# Patient Record
Sex: Male | Born: 1995
Health system: Southern US, Community
[De-identification: ages and names within clinical notes are randomized; demographics above are authoritative.]

## PROBLEM LIST (undated history)

## (undated) DIAGNOSIS — L0591 Pilonidal cyst without abscess: Secondary | ICD-10-CM

## (undated) DIAGNOSIS — S82409A Unspecified fracture of shaft of unspecified fibula, initial encounter for closed fracture: Secondary | ICD-10-CM

## (undated) DIAGNOSIS — Z8711 Personal history of peptic ulcer disease: Secondary | ICD-10-CM

## (undated) DIAGNOSIS — R221 Localized swelling, mass and lump, neck: Secondary | ICD-10-CM

## (undated) DIAGNOSIS — S83512A Sprain of anterior cruciate ligament of left knee, initial encounter: Secondary | ICD-10-CM

## (undated) DIAGNOSIS — Z8719 Personal history of other diseases of the digestive system: Secondary | ICD-10-CM

## (undated) DIAGNOSIS — Q549 Hypospadias, unspecified: Secondary | ICD-10-CM

## (undated) HISTORY — PX: FINGER SURGERY: SHX640

## (undated) HISTORY — PX: OTHER SURGICAL HISTORY: SHX169

## (undated) HISTORY — PX: KNEE ARTHROSCOPY: SUR90

## (undated) HISTORY — DX: Personal history of peptic ulcer disease: Z87.11

## (undated) HISTORY — DX: Hypospadias, unspecified: Q54.9

## (undated) HISTORY — DX: Localized swelling, mass and lump, neck: R22.1

## (undated) HISTORY — DX: Sprain of anterior cruciate ligament of left knee, initial encounter: S83.512A

## (undated) HISTORY — DX: Unspecified fracture of shaft of unspecified fibula, initial encounter for closed fracture: S82.409A

## (undated) HISTORY — DX: Personal history of other diseases of the digestive system: Z87.19

## (undated) HISTORY — DX: Pilonidal cyst without abscess: L05.91

---
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
## 2015-06-11 ENCOUNTER — Other Ambulatory Visit: Payer: Self-pay | Admitting: Otolaryngology

## 2015-06-11 ENCOUNTER — Ambulatory Visit
Admission: RE | Admit: 2015-06-11 | Discharge: 2015-06-11 | Disposition: A | Discharge status: Home / Self Care | Payer: BC Managed Care – PPO | Source: Ambulatory Visit | Attending: Otolaryngology | Admitting: Otolaryngology

## 2015-06-11 DIAGNOSIS — R591 Generalized enlarged lymph nodes: Secondary | ICD-10-CM

## 2015-06-12 ENCOUNTER — Other Ambulatory Visit: Payer: Self-pay | Admitting: Otolaryngology

## 2015-06-12 DIAGNOSIS — R591 Generalized enlarged lymph nodes: Secondary | ICD-10-CM

## 2015-06-13 ENCOUNTER — Ambulatory Visit
Admission: RE | Admit: 2015-06-13 | Discharge: 2015-06-13 | Disposition: A | Discharge status: Home / Self Care | Payer: Self-pay | Source: Ambulatory Visit | Attending: Otolaryngology | Admitting: Otolaryngology

## 2015-06-13 DIAGNOSIS — R591 Generalized enlarged lymph nodes: Secondary | ICD-10-CM

## 2016-03-15 ENCOUNTER — Encounter: Payer: Self-pay | Admitting: Family Medicine

## 2016-03-15 ENCOUNTER — Ambulatory Visit (INDEPENDENT_AMBULATORY_CARE_PROVIDER_SITE_OTHER): Payer: BC Managed Care – PPO | Admitting: Family Medicine

## 2016-03-15 VITALS — BP 106/78 | HR 63 | Temp 98.1°F | Ht 71.5 in | Wt 244.2 lb

## 2016-03-15 DIAGNOSIS — R221 Localized swelling, mass and lump, neck: Secondary | ICD-10-CM | POA: Diagnosis not present

## 2016-03-15 DIAGNOSIS — Z0001 Encounter for general adult medical examination with abnormal findings: Secondary | ICD-10-CM | POA: Diagnosis not present

## 2016-03-15 HISTORY — DX: Localized swelling, mass and lump, neck: R22.1

## 2016-03-15 LAB — TSH: TSH: 1.5 u[IU]/mL (ref 0.35–5.50)

## 2016-03-15 LAB — LIPID PANEL
CHOL/HDL RATIO: 3
Cholesterol: 146 mg/dL (ref 0–200)
HDL: 46.4 mg/dL (ref >39.00)
LDL CALC: 92 mg/dL (ref 0–99)
NONHDL: 99.87
Triglycerides: 40 mg/dL (ref 0.0–149.0)
VLDL: 8 mg/dL (ref 0.0–40.0)

## 2016-03-15 LAB — POC URINALSYSI DIPSTICK (AUTOMATED)
Bilirubin, UA: NEGATIVE
Glucose, UA: NEGATIVE
KETONES UA: NEGATIVE
Leukocytes, UA: NEGATIVE
Nitrite, UA: NEGATIVE
PROTEIN UA: NEGATIVE
RBC UA: NEGATIVE
SPEC GRAV UA: 1.025
UROBILINOGEN UA: 0.2
pH, UA: 5.5

## 2016-03-15 LAB — CBC WITH DIFFERENTIAL/PLATELET
BASOS ABS: 0 10*3/uL (ref 0.0–0.1)
BASOS PCT: 0.5 % (ref 0.0–3.0)
EOS ABS: 0.1 10*3/uL (ref 0.0–0.7)
Eosinophils Relative: 2 % (ref 0.0–5.0)
HEMATOCRIT: 46.8 % (ref 39.0–52.0)
HEMOGLOBIN: 15.9 g/dL (ref 13.0–17.0)
LYMPHS PCT: 36.2 % (ref 12.0–46.0)
Lymphs Abs: 2.1 10*3/uL (ref 0.7–4.0)
MCHC: 33.9 g/dL (ref 30.0–36.0)
MCV: 86.8 fl (ref 78.0–100.0)
MONO ABS: 0.4 10*3/uL (ref 0.1–1.0)
Monocytes Relative: 6.5 % (ref 3.0–12.0)
Neutro Abs: 3.2 10*3/uL (ref 1.4–7.7)
Neutrophils Relative %: 54.8 % (ref 43.0–77.0)
PLATELETS: 214 10*3/uL (ref 150.0–400.0)
RBC: 5.39 Mil/uL (ref 4.22–5.81)
RDW: 13.5 % (ref 11.5–14.6)
WBC: 5.8 10*3/uL (ref 4.5–10.5)

## 2016-03-15 LAB — COMPREHENSIVE METABOLIC PANEL
ALT: 40 U/L (ref 0–53)
AST: 26 U/L (ref 0–37)
Albumin: 4.7 g/dL (ref 3.5–5.2)
Alkaline Phosphatase: 57 U/L (ref 39–117)
BILIRUBIN TOTAL: 0.6 mg/dL (ref 0.2–1.2)
BUN: 13 mg/dL (ref 6–23)
CALCIUM: 9.4 mg/dL (ref 8.4–10.5)
CHLORIDE: 105 meq/L (ref 96–112)
CO2: 25 meq/L (ref 19–32)
CREATININE: 0.98 mg/dL (ref 0.40–1.50)
GFR: 102.98 mL/min (ref >60.00)
Glucose, Bld: 94 mg/dL (ref 70–99)
Potassium: 4.2 mEq/L (ref 3.5–5.1)
SODIUM: 140 meq/L (ref 135–145)
Total Protein: 7.2 g/dL (ref 6.0–8.3)

## 2016-03-15 NOTE — Assessment & Plan Note (Signed)
S: patient noted some enlargement left lateral neck last spring. Saw his pediatrician. Ultrasound was done stating likely lipoma but noting consideration of MRI or CT if further clinical cocnern. Patient states since it was noted he has only noted mild enlargement. No pain or discomfort.  A/P: We discussed MRI or CT and discussed concerns of cost and radiation. Patient states he would not want to have removed unless it needed to be- considered ENT referral initially. Mother and grandmother are primarily concerned and he is going to go talk with them and then let me know within a week his decision.

## 2016-03-15 NOTE — Progress Notes (Signed)
Pre visit review using our clinic review tool, if applicable. No additional management support is needed unless otherwise documented below in the visit note. 

## 2016-03-15 NOTE — Patient Instructions (Signed)
Labs before you leave  Talk things over with your mom and grandma. See considerations I noted on the imaging report. I would really love to know what you guys would wish to do within a week or so.   Declined flu shot  I agree targetting a weight of 225 is reasonable over next year

## 2016-03-15 NOTE — Progress Notes (Signed)
Phone: [phone]  Subjective:  Patient presents today to establish care. Former patient of Dr. Aggie Hacker at [facility]. Immunizations reviewed and up to date- including Tdap in 2008. Records in [place]  Chief complaint-noted.   See problem oriented charting  The following were reviewed and entered/updated in epic: No past medical history on file. There are no active problems to display for this patient.  Past Surgical History:  Procedure Laterality Date  . FINGER SURGERY Right    index finger  . KNEE ARTHROSCOPY Left    tendon repair    Family History  Problem Relation Age of Onset  . Breast cancer Mother     Medications- reviewed and updated Current Outpatient Prescriptions  Medication Sig Dispense Refill  . loratadine (CLARITIN) 10 MG tablet Take 10 mg by mouth daily.     No current facility-administered medications for this visit.     Allergies-reviewed and updated No Known Allergies  Social History   Social History  . Marital status: Single    Spouse name: N/A  . Number of children: 0  . Years of education: N/A   Occupational History  . student    Social History Main Topics  . Smoking status: Former Smoker    Types: Cigarettes    Quit date: 04/04/2014  . Smokeless tobacco: Former Neurosurgeon    Types: Chew    Quit date: 04/04/2014  . Alcohol use 0.0 oz/week     Comment: occasional  . Drug use: No  . Sexual activity: Not Asked   Other Topics Concern  . None   Social History Narrative  . None    ROS--Full ROS was completed Review of Systems  Constitutional: Negative for chills and fever.  HENT: Negative for hearing loss.   Eyes: Negative for blurred vision and double vision.  Respiratory: Negative for cough and hemoptysis.   Cardiovascular: Negative for chest pain and palpitations.  Gastrointestinal: Negative for heartburn and nausea.  Genitourinary: Negative for dysuria and urgency.  Musculoskeletal: Negative for myalgias and neck pain.    Skin: Negative for itching and rash.  Neurological: Negative for dizziness, tingling and headaches.  Endo/Heme/Allergies: Does not bruise/bleed easily.  Psychiatric/Behavioral: Negative for hallucinations and substance abuse.   Objective: BP 106/78 (BP Location: Left Arm, Patient Position: Sitting, Cuff Size: Large)   Pulse 63   Temp 98.1 F (36.7 C) (Oral)   Ht 5' 11.5" (1.816 m)   Wt 244 lb 3.2 oz (110.8 kg)   SpO2 97%   BMI 33.58 kg/m  Gen: NAD, resting comfortably HEENT: Mucous membranes are moist. Oropharynx normal. TM normal. Eyes: sclera and lids normal, PERRLA Neck: no thyromegaly, no cervical lymphadenopathy On left side of neck has 7 x 7 cm round soft raised mass CV: RRR no murmurs rubs or gallops Lungs: CTAB no crackles, wheeze, rhonchi Abdomen: soft/nontender/nondistended/normal bowel sounds. No rebound or guarding.  Ext: no edema Skin: warm, dry Neuro: 5/5 strength in upper and lower extremities, normal gait, normal reflexes  Assessment/Plan:  Mass of left side of neck S: patient noted some enlargement left lateral neck last spring. Saw his pediatrician. Ultrasound was done stating likely lipoma but noting consideration of MRI or CT if further clinical cocnern. Patient states since it was noted he has only noted mild enlargement. No pain or discomfort.  A/P: We discussed MRI or CT and discussed concerns of cost and radiation. Patient states he would not want to have removed unless it needed to be- considered ENT referral initially. Mother  and grandmother are primarily concerned and he is going to go talk with them and then let me know within a week his decision.   20 y.o. male presenting for annual physical.  Health Maintenance counseling: 1. Anticipatory guidance: Patient counseled regarding regular dental exams, eye exams, wearing seatbelts.  2. Risk factor reduction:  Advised patient of need for regular exercise and diet rich and fruits and vegetables to reduce  risk of heart attack and stroke. Needs some weight loss. At least to HS weight 225 3. Immunizations/screenings/ancillary studies 4. Prostate cancer screening- no family history start age 20  5. Colon cancer screening - no family history, start age 20 6. testicular cancer screening- will need to discuss monthly self exams 7. Std screening- declines. Practices abstinence  Return in about 1 year (around 03/15/2017) for physical.   Orders Placed This Encounter  Procedures  . CBC with Differential/Platelet  . Comprehensive metabolic panel    [place]    Order Specific Question:   Has the patient fasted?    Answer:   No  . TSH    [place]  . Lipid panel    [place]    Order Specific Question:   Has the patient fasted?    Answer:   No  . POCT Urinalysis Dipstick (Automated)   Return precautions advised.  Tana ConchStephen Leylah Tarnow, MD

## 2016-05-01 ENCOUNTER — Encounter: Payer: Self-pay | Admitting: Family Medicine

## 2016-05-01 DIAGNOSIS — Z8711 Personal history of peptic ulcer disease: Secondary | ICD-10-CM | POA: Insufficient documentation

## 2016-05-01 DIAGNOSIS — Z8719 Personal history of other diseases of the digestive system: Secondary | ICD-10-CM | POA: Insufficient documentation

## 2016-08-23 ENCOUNTER — Encounter: Payer: Self-pay | Admitting: Family Medicine

## 2016-08-23 ENCOUNTER — Ambulatory Visit (INDEPENDENT_AMBULATORY_CARE_PROVIDER_SITE_OTHER): Payer: BC Managed Care – PPO | Admitting: Family Medicine

## 2016-08-23 VITALS — BP 108/68 | HR 58 | Temp 99.1°F | Ht 71.5 in | Wt 251.8 lb

## 2016-08-23 DIAGNOSIS — H9192 Unspecified hearing loss, left ear: Secondary | ICD-10-CM

## 2016-08-23 DIAGNOSIS — H6122 Impacted cerumen, left ear: Secondary | ICD-10-CM

## 2016-08-23 NOTE — Patient Instructions (Signed)
Ear looks great after irrigation  Could trial once a week 3-4 drops of mineral oil for prevention or could use this if starts to feel more stopped up- a few times a day

## 2016-08-23 NOTE — Progress Notes (Signed)
Subjective:  Marya Amslerrescott Icenhour IV is a 21 y.o. year old very pleasant male patient who presents for/with See problem oriented charting ROS- no fever, chills, nausea, vomiting. Denies ear pain.    Past Medical History-  Patient Active Problem List   Diagnosis Date Noted  . Mass of left side of neck 03/15/2016    Priority: Medium  . History of gastric ulcer     Priority: Low    Medications- reviewed and updated No current outpatient prescriptions on file.   No current facility-administered medications for this visit.     Objective: BP 108/68 (BP Location: Left Arm, Patient Position: Sitting, Cuff Size: Large)   Pulse (!) 58   Temp 99.1 F (37.3 C) (Oral)   Ht 5' 11.5" (1.816 m)   Wt 251 lb 12.8 oz (114.2 kg)   SpO2 98%   BMI 34.63 kg/m  Gen: NAD, resting comfortably TM normal on right. TM on left completely obstructed. Cerumen irrigated with complete resolution of blockage- TM normal. Canal mildly red and irritated at bottom of canal (attempted [facility]). Oropharynx normal. Nares normal.  CV: RRR no murmurs rubs or gallops Lungs: CTAB no crackles, wheeze, rhonchi Ext: no edema Skin: warm, dry  Assessment/Plan:  Hearing loss of left ear Impacted cerumen of left ear S: Patient states for several years he has had issues with cerumen impactions. Bilateral , left always worse. Winter worse.   January started with feeling of fullness left ear then gradually worsened- noted wax on pillow some nights. Saturday felt really full, on Sunday couldn't hear a thing. Just the left side. Right side with no issues  [facility] and [facility] curetting as cerumen impaction and noted as potential source of hearing loss - apparently there was some pain and light level of bleeding A/P: Left cerumen impaction causing hearing loss. Upon removal full relief of hearing loss. Remaining canal mildly red- suspect may have been from the curetting attempted  yesterday. We also discussed using mineral oil perhaps once a week 3-4 drops to help with prevention or could increase if ears begin to feel full  Return precautions advised.  Tana ConchStephen Ginnie Marich, MD

## 2016-08-23 NOTE — Progress Notes (Signed)
Pre visit review using our clinic review tool, if applicable. No additional management support is needed unless otherwise documented below in the visit note. 

## 2016-11-09 ENCOUNTER — Ambulatory Visit (INDEPENDENT_AMBULATORY_CARE_PROVIDER_SITE_OTHER): Payer: BC Managed Care – PPO | Admitting: Family Medicine

## 2016-11-09 ENCOUNTER — Encounter: Payer: Self-pay | Admitting: Family Medicine

## 2016-11-09 VITALS — BP 126/68 | HR 93 | Temp 98.4°F | Ht 71.5 in | Wt 245.4 lb

## 2016-11-09 DIAGNOSIS — S30861A Insect bite (nonvenomous) of abdominal wall, initial encounter: Secondary | ICD-10-CM | POA: Diagnosis not present

## 2016-11-09 DIAGNOSIS — W57XXXA Bitten or stung by nonvenomous insect and other nonvenomous arthropods, initial encounter: Secondary | ICD-10-CM

## 2016-11-09 MED ORDER — DOXYCYCLINE HYCLATE 100 MG PO TABS: 100.0000 mg | ORAL_TABLET | Freq: Two times a day (BID) | ORAL | 0 refills | Status: DC

## 2016-11-09 NOTE — Patient Instructions (Addendum)
Doxycycline for 7 days. Likely this is just local irritatoin from the tick bite and then getting the tick out but this will cover you for skin infections. Its also the treatment that we would use for lyme or rocky mountain spotted fever so you are covered for those- an added bonus!   Need to minimize time out in the son.   See us back for new or worsening symptoms but suspect this will gradually improve over the next 2-4 weeks ______________________________________________________ WE NOW OFFER   [facility] FAST TRACK!!!  SAME DAY Appointments for ACUTE CARE  Such as: Sprains, Injuries, cuts, abrasions, rashes, muscle pain, joint pain, back pain Colds, flu, sore throats, headache, allergies, cough, fever  Ear pain, sinus and eye infections Abdominal pain, nausea, vomiting, diarrhea, upset stomach Animal/insect bites  3 Easy Ways to Schedule: Walk-In Scheduling Call in scheduling Mychart Sign-up: [email or web address]

## 2016-11-09 NOTE — Progress Notes (Signed)
Subjective:  Derek Carter is a 21 y.o. year old very pleasant male patient who presents for/with See problem oriented charting ROS- no fever or chills.  No body aches. No rash on body  Past Medical History-  Patient Active Problem List   Diagnosis Date Noted  . Mass of left side of neck 03/15/2016    Priority: Medium  . History of gastric ulcer     Priority: Low   Medications- reviewed and updated, no rx prior to visit  Objective: BP 126/68   Pulse 93   Temp 98.4 F (36.9 C) (Oral)   Ht 5' 11.5" (1.816 m)   Wt 245 lb 6.4 oz (111.3 kg)   SpO2 99%   BMI 33.75 kg/m  Gen: NAD, resting comfortably CV: RRR no murmurs rubs or gallops Lungs: CTAB no crackles, wheeze, rhonchi Ext: no edema GU: at top of shaft of penis 0.5 x 0.5 cm indurated area with scab at the top (site of prior tick bite). Mild local erythema surrounding site  Assessment/Plan:  Tick bite of groin, initial encounter S: Patient noted a tick bite on Sunday at the base of his penis. He removed it initially but head was still intact. Apparently manipulated the area with a "sterlized" needle and was able to get the head out. States tick was not fully engorged- not sure how long it was there. He has had very mild pain in area as well as swelling in area that tick was removed. It has produced a bump on his penis with central scarring A/P: Tick bite on shaft of penis. Localized irritation I suspect is most likely due to inflammation from bite as well as manipulation with needle that was done. We opted in the end given importance of this region to a young male and possible early cellulitis to cover with doxycycline. Discussed would also cover for most common tick bite ailments as well.   Meds ordered this encounter  Medications  . doxycycline (VIBRA-TABS) 100 MG tablet    Sig: Take 1 tablet (100 mg total) by mouth 2 (two) times daily.    Dispense:  14 tablet    Refill:  0    Return precautions advised.  Tana ConchStephen  Hunter, MD

## 2017-08-22 IMAGING — US US SOFT TISSUE HEAD/NECK
1 series · 14 of 25 positions shown · non-contrast
Comparison: None.

CLINICAL DATA: Left neck swelling x3-4 months, intermittent
tenderness

EXAM:
THYROID ULTRASOUND
TECHNIQUE: Ultrasound examination of the thyroid gland and adjacent soft
tissues was performed.

[Series 1: us soft tissue head/neck · 0.08mm/px · 14 of 73 slices shown]
[im 1/73]
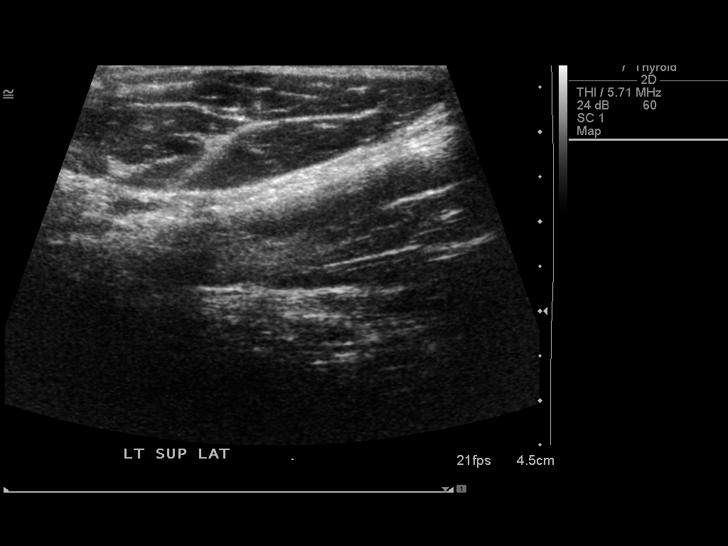
[im 7/73]
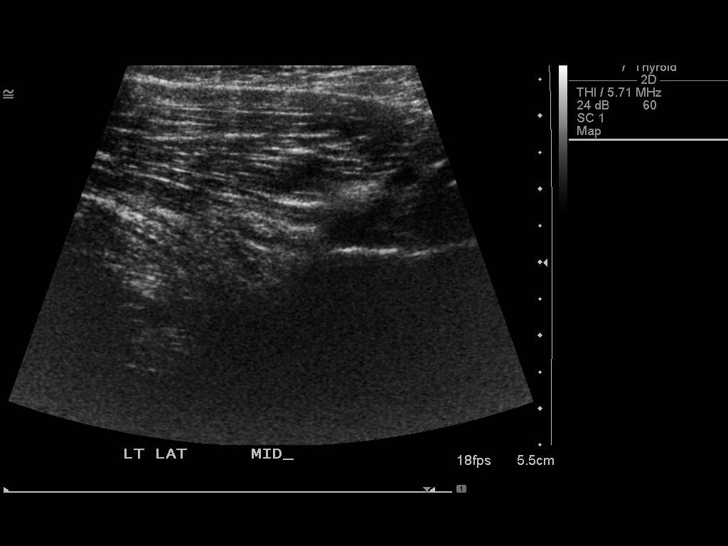
[im 13/73]
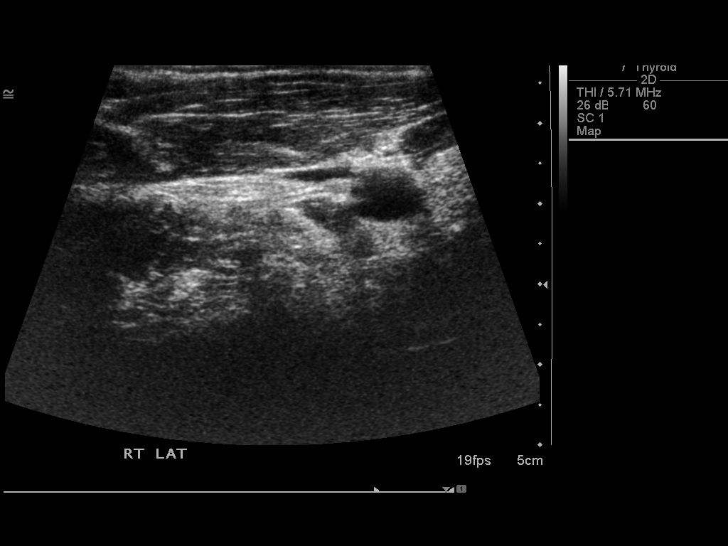
[im 19/73]
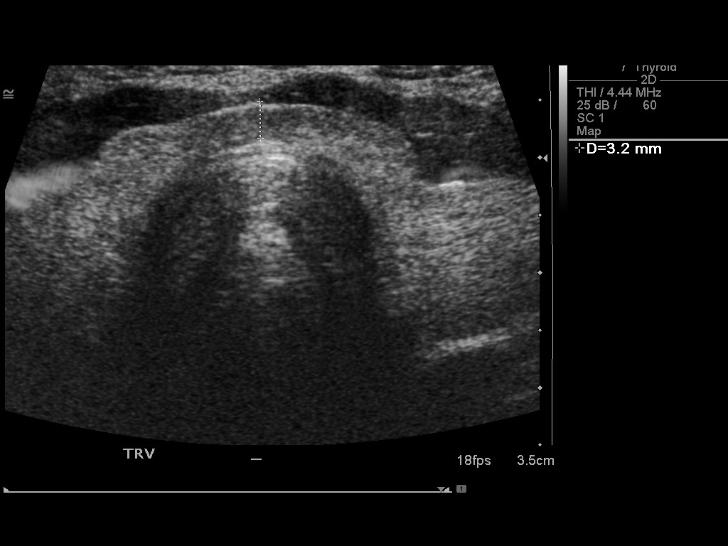
[im 25/73]
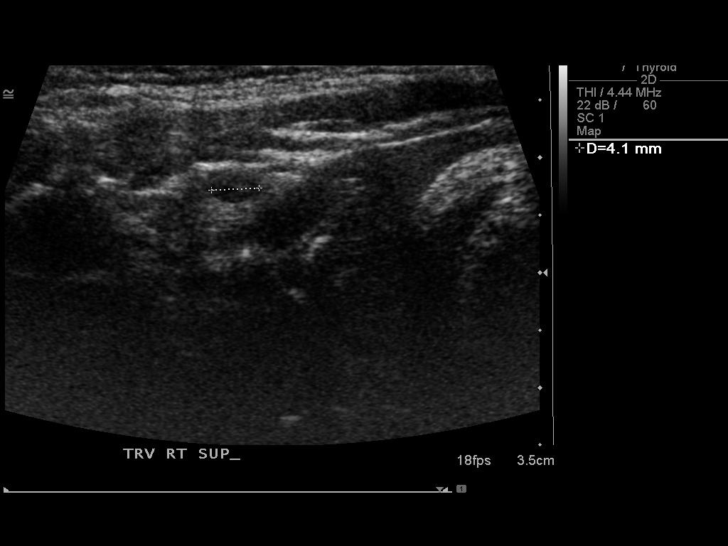
[im 28/73]
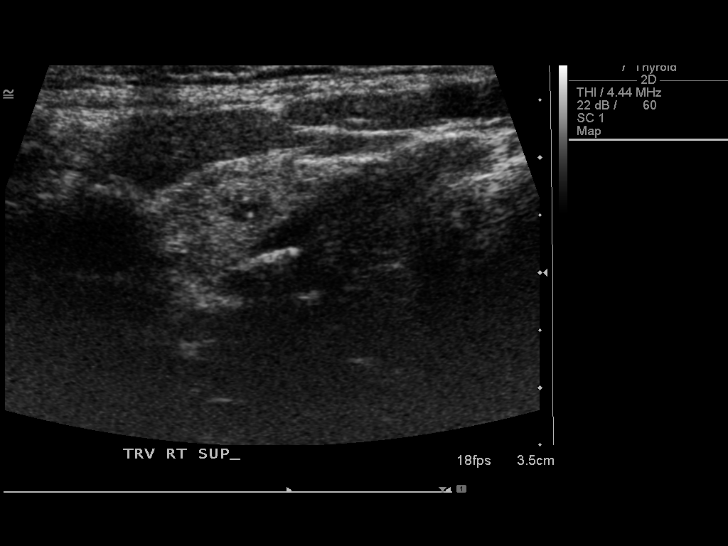
[im 34/73]
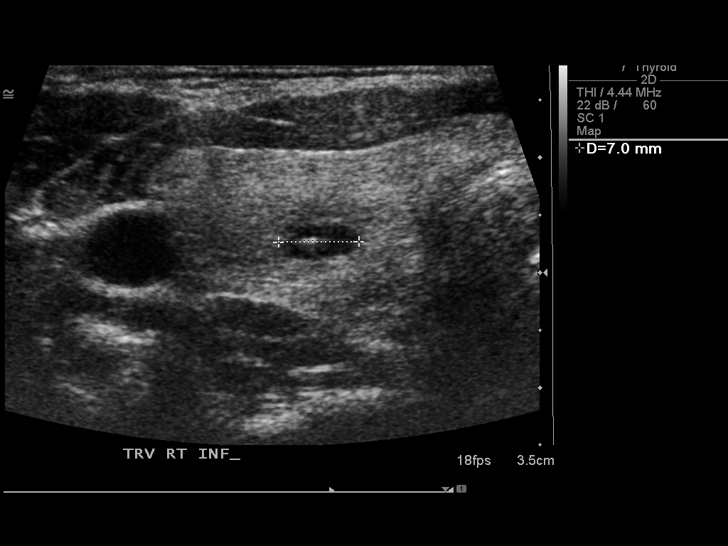
[im 40/73]
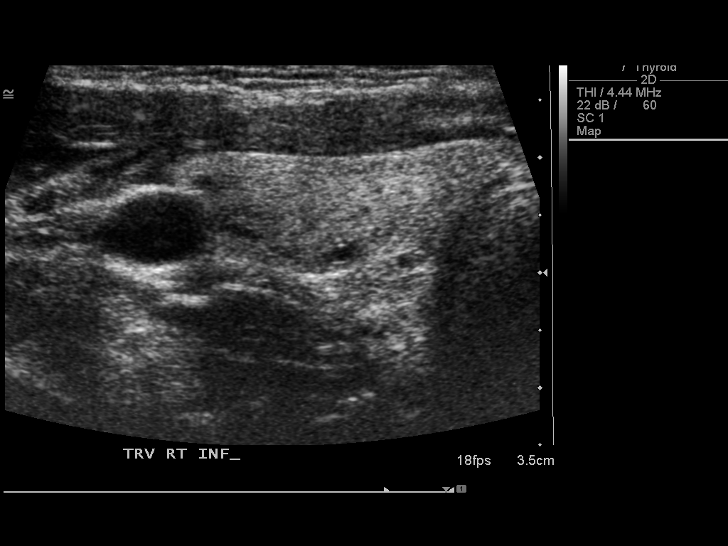
[im 46/73]
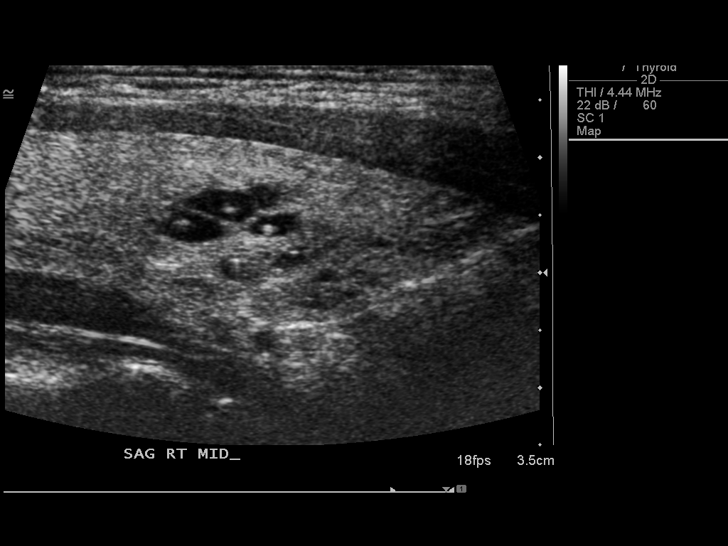
[im 49/73]
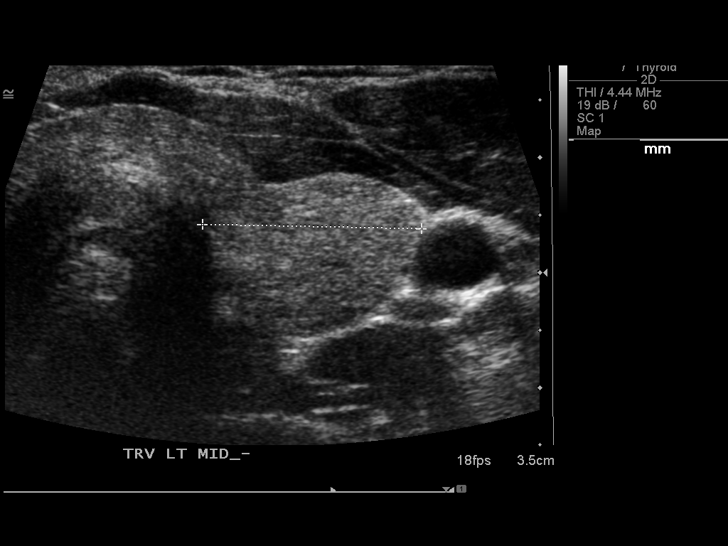
[im 55/73]
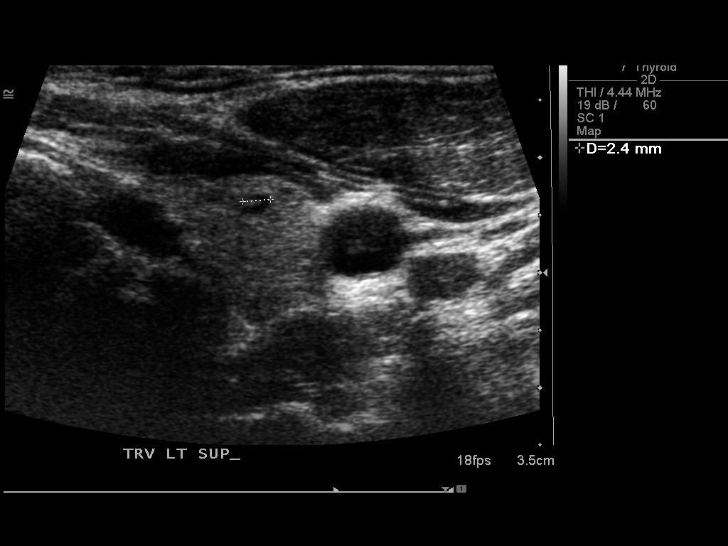
[im 61/73]
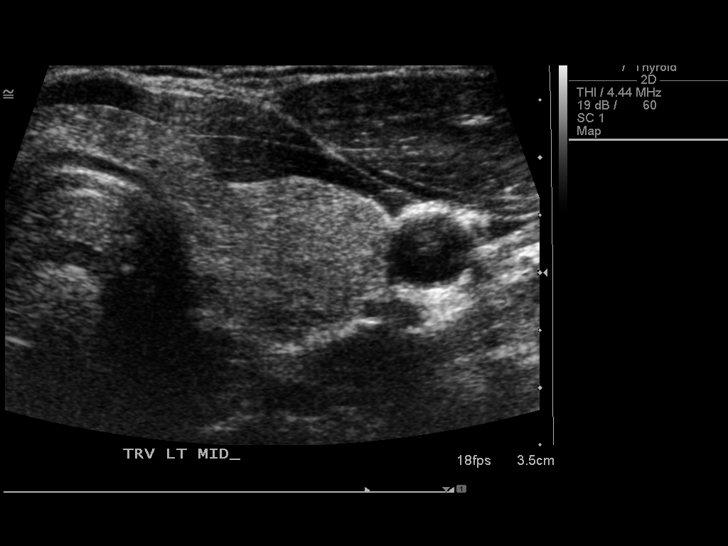
[im 67/73]
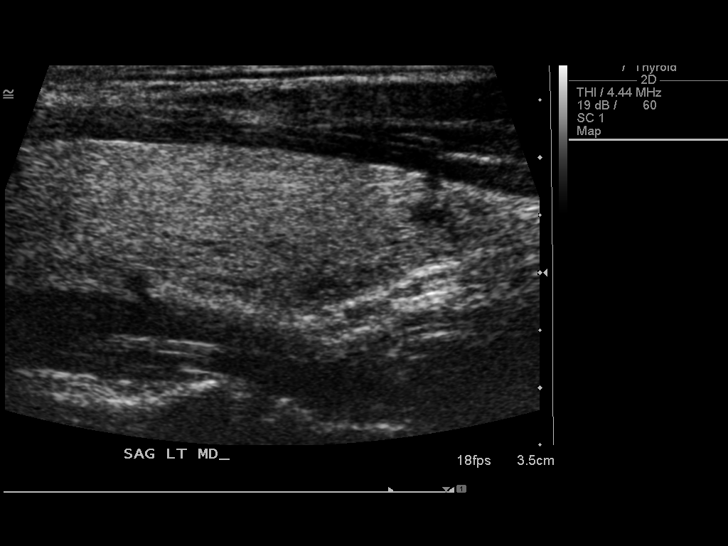
[im 73/73]
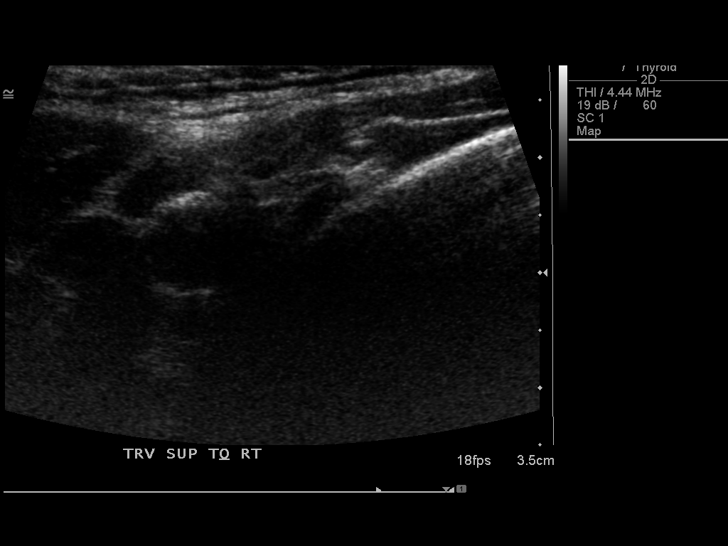

[14 of 25 positions shown; findings below may reference images not displayed]

FINDINGS: Right thyroid lobe

Measurements: 54 x 14 x 22 mm. Scattered complex cysts measuring 7mm
or less maximum diameter.

Left thyroid lobe

Measurements: 57 x 16 x 19 mm. Scattered complex cysts measuring
less than 5 mm maximum diameter.

Isthmus

Thickness: 3.2 mm.  No nodules visualized.

Lymphadenopathy

None visualized.
IMPRESSION: 1. Mild thyromegaly with small bilateral complex cysts. Findings do
not meet current consensus criteria for biopsy. Follow-up by
clinical exam is recommended. If patient has known risk factors for
thyroid carcinoma, consider follow-up ultrasound in 12 months. If
patient is clinically hyperthyroid, consider nuclear medicine
thyroid uptake and scan. This recommendation follows the consensus
statement: Management of Thyroid Nodules Detected as US: Society of
Radiologists in Ultrasound Consensus Conference Statement. Radiology

## 2017-08-24 IMAGING — US US SOFT TISSUE HEAD/NECK
1 series · 14 of 25 positions shown · non-contrast
Comparison: Thyroid ultrasound 06/11/2015

ADDENDUM:
No additional charge for today's study in follow-up to neck
ultrasound of 06/11/2015.
CLINICAL DATA: Asymmetric swelling left neck

EXAM:
ULTRASOUND OF HEAD/NECK SOFT TISSUES
TECHNIQUE: Ultrasound examination of the head and neck soft tissues was
performed in the area of clinical concern.

[Series 1: us soft tissue head/neck · 0.08mm/px · 14 of 30 slices shown]
[im 1/30]
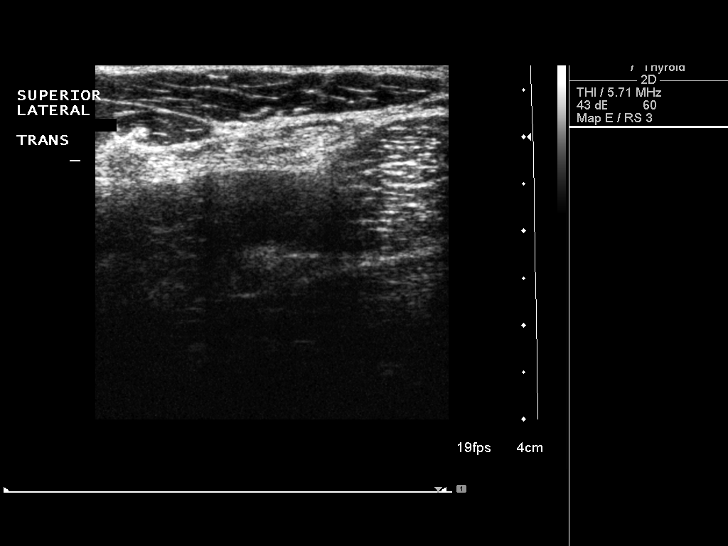
[im 3/30]
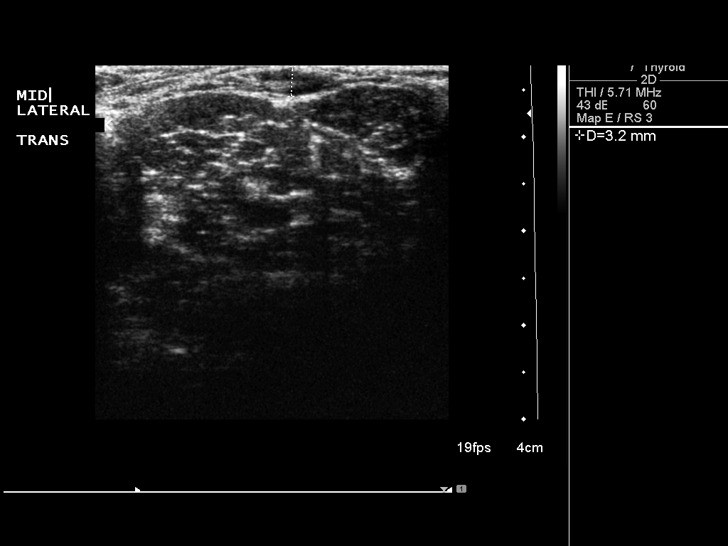
[im 5/30]
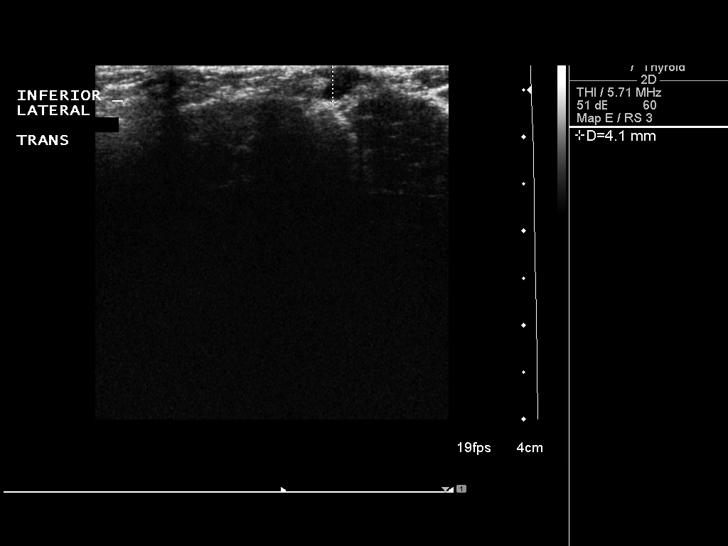
[im 8/30]
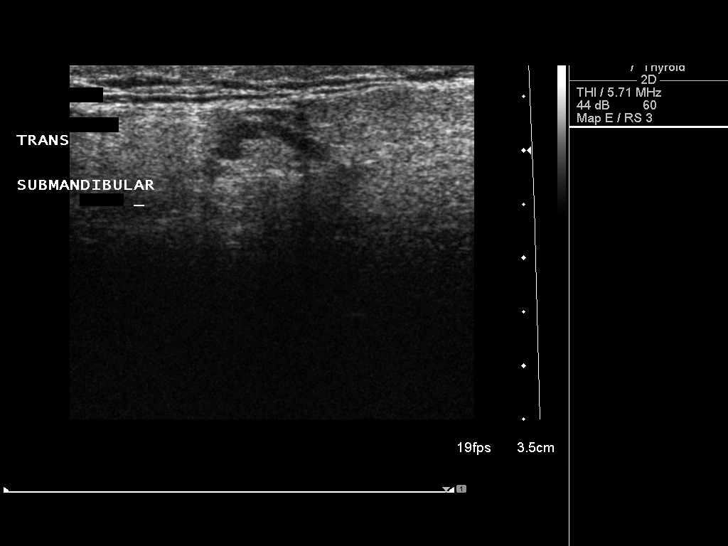
[im 10/30]
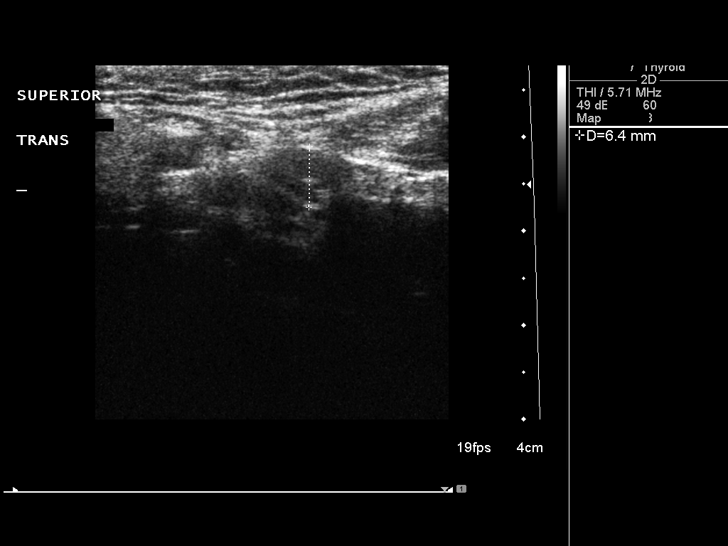
[im 11/30]
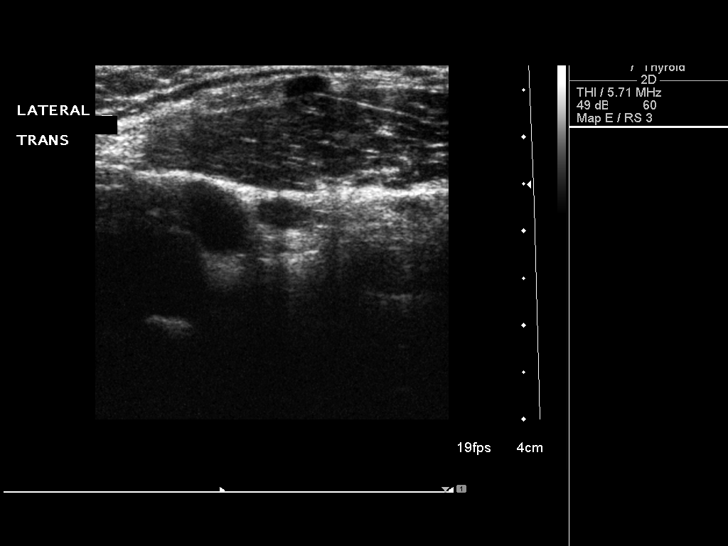
[im 14/30]
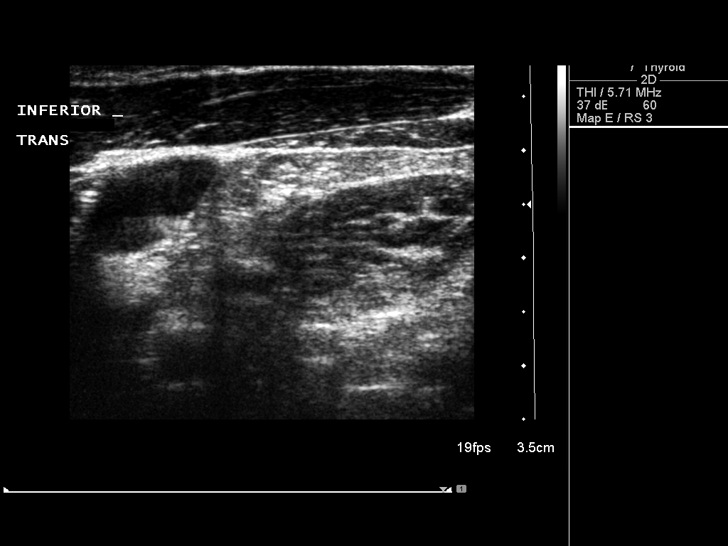
[im 16/30]
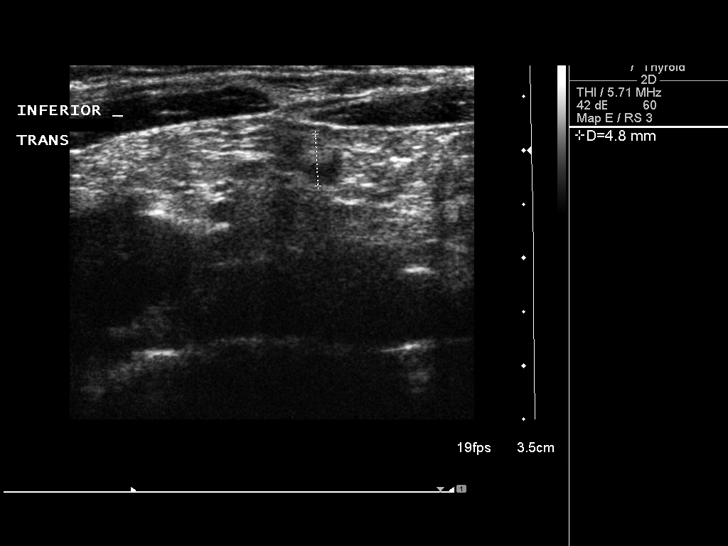
[im 19/30]
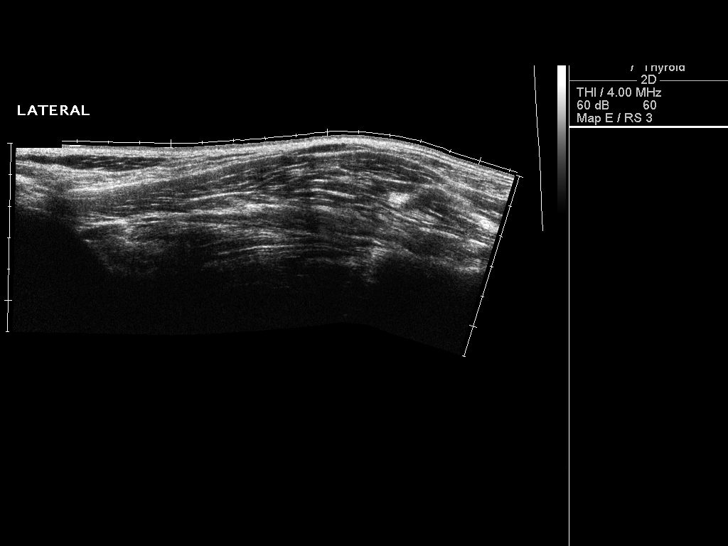
[im 20/30]
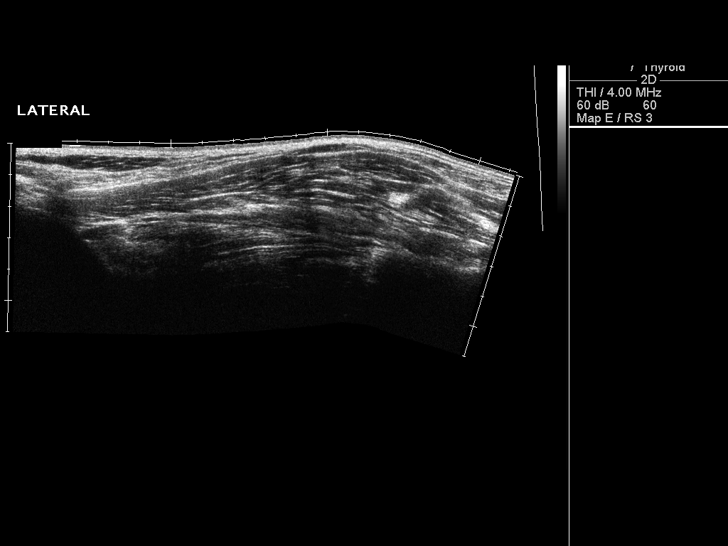
[im 22/30]
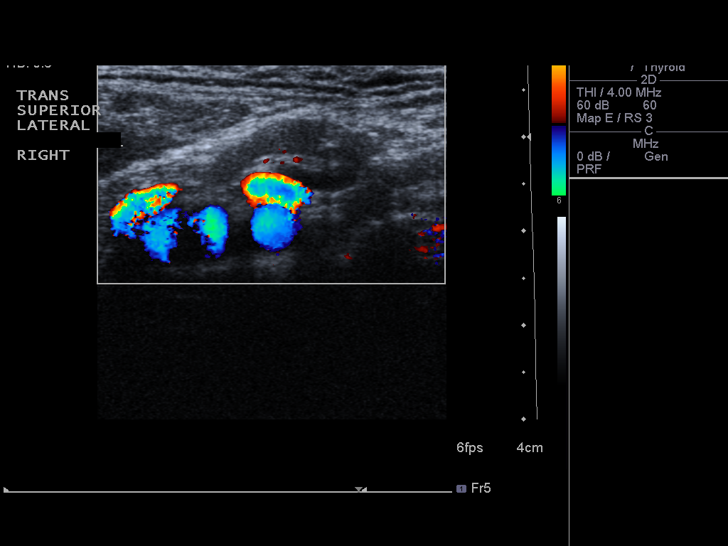
[im 25/30]
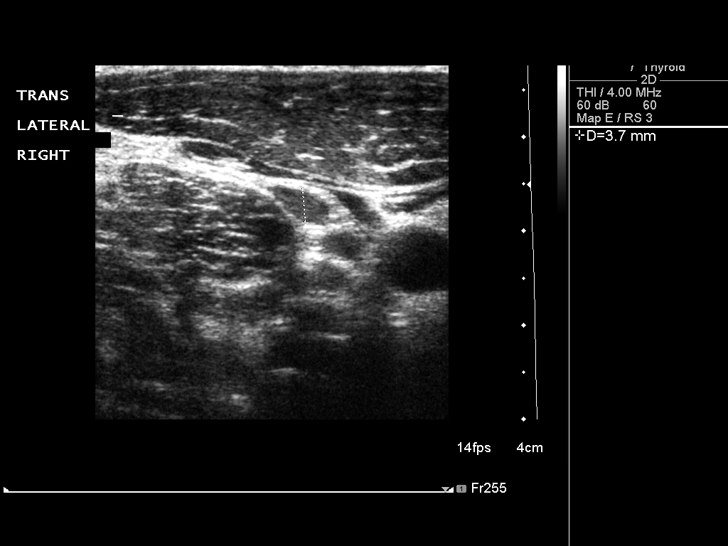
[im 27/30]
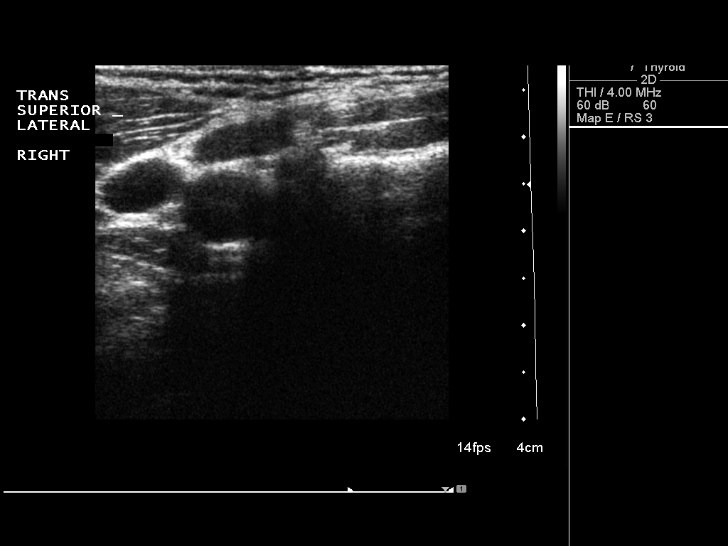
[im 30/30]
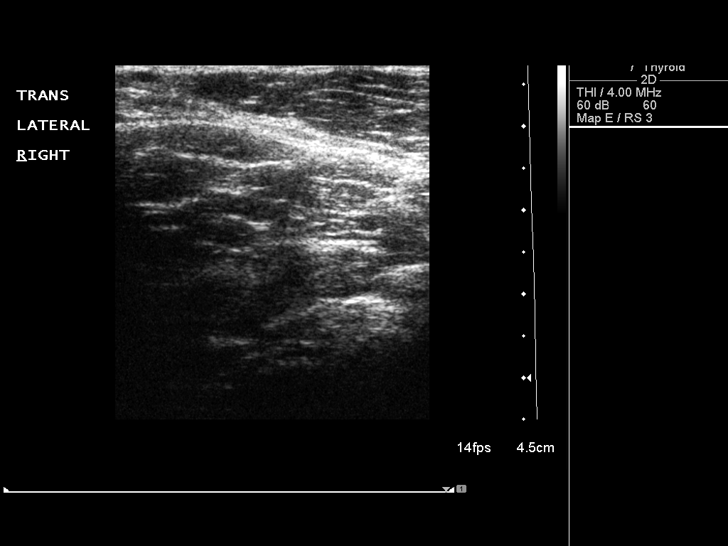

[14 of 25 positions shown; findings below may reference images not displayed]

FINDINGS: The patient returned today for more detailed ultrasound evaluation
of the left neck. At my examination, there is mild asymmetry and
fullness of the left neck overlying the left sternocleidomastoid
muscle. This is soft and non fixed.

Targeted ultrasound of the left neck reveals no cystic or solid
mass. The subcutaneous fat appears normal. The sternocleidomastoid
muscle appears normal.

Scattered small lymph nodes are present in the left neck measuring
3-5 mm. No pathologic adenopathy or mass lesion is identified.
IMPRESSION: Targeted ultrasound of the left neck swelling reveals no cystic or
solid mass. The palpable abnormality may be a lipoma. If further
imaging evaluation is felt necessary, CT neck with contrast or MRI
of the neck without with contrast could be performed.

## 2018-02-13 ENCOUNTER — Encounter: Payer: Self-pay | Admitting: Family Medicine

## 2018-02-13 ENCOUNTER — Ambulatory Visit: Payer: Self-pay | Admitting: *Deleted

## 2018-02-13 ENCOUNTER — Ambulatory Visit: Payer: BC Managed Care – PPO | Admitting: Family Medicine

## 2018-02-13 VITALS — BP 128/70 | HR 108 | Temp 98.6°F | Ht 71.5 in | Wt 242.0 lb

## 2018-02-13 DIAGNOSIS — H6121 Impacted cerumen, right ear: Secondary | ICD-10-CM

## 2018-02-13 DIAGNOSIS — Z23 Encounter for immunization: Secondary | ICD-10-CM | POA: Diagnosis not present

## 2018-02-13 NOTE — Telephone Encounter (Signed)
Noted  

## 2018-02-13 NOTE — Telephone Encounter (Signed)
See note

## 2018-02-13 NOTE — Patient Instructions (Addendum)
Health Maintenance Due  Topic Date Due  . INFLUENZA VACCINE -please schedule this Fall 02/02/2018    Derek Carter used debrox on you if you want to try that in the future when you feel congested  Could also try 3-4 drops of mineral oil for 30 seconds in each ear once or twice a week for prevention and then up to 3-4x a day if feel congested.

## 2018-02-13 NOTE — Telephone Encounter (Signed)
Pt called with blockage in ear and ear wax buildup until 02/11/18; he states that he [facility]; there he got dizzy and having ear ringing; he also says that his ear starting ringing and pain on 02/11/18; he says his greatest concern is the ear "fullness/pressure that he feels"; the pt states that [facility] prescribed drops for that ear(not sure of the name, but he used the drop from 8/10 pm until 8/12 am; recommendations made per nurse triage protocol to include seeing a physician within 24 hours; the pt is offered and accepted appointment with Dr Tana ConchStephen Carter, [facility]; 02/13/18 at 1500; he verbalizes understanding; will route to office for notification of this upcoming appointment.  Reason for Disposition . Earache  Answer Assessment - Initial Assessment Questions 1. DESCRIPTION: "What type of hearing problem are you having? Describe it for me." (e.g., complete hearing loss, partial loss)     Partial hearing loss (everything sounds muffled) 2. LOCATION: "One or both ears?" If one, ask: "Which ear?"     Right eat 3. SEVERITY: "Can you hear anything?" If so, ask: "What can you hear?" (e.g., ticking watch, whisper, talking)     Talking (background noise can make it difficult to hear) 4. ONSET: "When did this begin?" "Did it start suddenly or come on gradually?"     02/11/18 5. PATTERN: "Does this come and go, or has it been constant since it started?"     constant 6. PAIN: "Is there any pain in your ear(s)?"  (Scale 1-10; or mild, moderate, severe)     mild 7. CAUSE: "What do you think is causing this hearing problem?"     Ear wax build up; [facility] 8. OTHER SYMPTOMS: "Do you have any other symptoms?" (e.g., dizziness, ringing in ears)     Dizziness x 1 episode on 02/11/18; frequent ear ringing; ear feels full 9. PREGNANCY: "Is there any chance you are pregnant?" "When was your last menstrual period?"  n/a  Protocols used: HEARING LOSS OR CHANGE-A-AH

## 2018-02-13 NOTE — Progress Notes (Signed)
Subjective:  Derek Carter is a 22 y.o. year old very pleasant male patient who presents for/with See problem oriented charting ROS- tinnitus, ringing in ear. No cough, congestion   Past Medical History-  Patient Active Problem List   Diagnosis Date Noted  . Mass of left side of neck 03/15/2016    Priority: Medium  . History of gastric ulcer     Priority: Low    Medications- reviewed and updated Current Outpatient Medications  Medication Sig Dispense Refill  . neomycin-polymyxin-hydrocortisone (CORTISPORIN) 3.5-10000-1 OTIC suspension Place in ear(s).     No current facility-administered medications for this visit.     Objective: BP 128/70 (BP Location: Left Arm, Patient Position: Sitting, Cuff Size: Large)   Pulse (!) 108   Temp 98.6 F (37 C) (Oral)   Ht 5' 11.5" (1.816 m)   Wt 242 lb (109.8 kg)   SpO2 97%   BMI 33.28 kg/m  Gen: NAD, resting comfortably CV: RRR no murmurs rubs or gallops Lungs: CTAB no crackles, wheeze, rhonchi  Procedure note: Cerumen noted in right ear. Debrox placed in ear initially.  Irrigation with water and peroxide performed. Full view of tympanic membrane after procedure.  Patient tolerated procedure well  Assessment/Plan:  Right ear impacted cerumen/right ear pressure S: right ear congestion since Saturday. Felt mild pressure and tinnitus at times. Happens every year at least. He wants to know about preventative measures. Doesn't use q tips. [facility]- he felt worse after this. They placed him on cortisporin which seemed to help lately.  A/P: Irrigation completed today- full relief of ear pressure- patient denies tinnitus after this as well. We discussed per AVS ways to help with prevention  Lab/Order associations: Right ear impacted cerumen  Need for prophylactic vaccination with combined diphtheria-tetanus-pertussis (DTP) vaccine - Plan: Tdap vaccine greater than or equal to 7yo IM   Return  precautions advised.  Tana ConchStephen Hunter, MD

## 2018-07-04 ENCOUNTER — Encounter: Payer: Self-pay | Admitting: Family Medicine

## 2018-07-04 ENCOUNTER — Ambulatory Visit: Payer: BC Managed Care – PPO | Admitting: Family Medicine

## 2018-07-04 VITALS — BP 120/82 | HR 105 | Temp 99.7°F | Ht 71.5 in | Wt 232.6 lb

## 2018-07-04 DIAGNOSIS — B9789 Other viral agents as the cause of diseases classified elsewhere: Secondary | ICD-10-CM

## 2018-07-04 DIAGNOSIS — J329 Chronic sinusitis, unspecified: Secondary | ICD-10-CM

## 2018-07-04 DIAGNOSIS — R52 Pain, unspecified: Secondary | ICD-10-CM | POA: Diagnosis not present

## 2018-07-04 DIAGNOSIS — J029 Acute pharyngitis, unspecified: Secondary | ICD-10-CM

## 2018-07-04 LAB — POC INFLUENZA A&B (BINAX/QUICKVUE)
INFLUENZA A, POC: NEGATIVE
Influenza B, POC: NEGATIVE

## 2018-07-04 LAB — POCT RAPID STREP A (OFFICE): RAPID STREP A SCREEN: NEGATIVE

## 2018-07-04 NOTE — Patient Instructions (Addendum)
Sinsusitis Viral based on <10 days, no double sickening, lack of severity of symptoms in first 3 days. Educated on signs that bacterial infection may have developed (symptoms over 10 days, double sickening).   Treatment: -considered steroid: we opted out of prednisone for now -other symptomatic care with dayquil - and can layer ibuprofen 200-400 mg every 6-8 hours on top of this for throat irritation -Antibiotic indicated: no but would be willing to call one in if symptoms fail to improve by Monday or if they worsen again before the weekend.   Finally, we reviewed reasons to return to care including if symptoms worsen or persist or new concerns arise (particularly fever or shortness of breath)  After you have healed if you want to come back for flu shot you can though takes 2 weeks for it to be effective

## 2018-07-04 NOTE — Progress Notes (Signed)
PCP: Shelva MajesticHunter, Azalia Neuberger O, MD  Subjective:  Derek Carter is a 22 y.o. year old very pleasant male patient who presents with sinusitis vs. URI symptoms including nasal congestion, sinus tenderness  Patient started on Saturday with some congestion and throat irritation but was mild. On Sunday had similar symptoms, but by Monday noted significant worsening of symptoms- very congested in nasal passages and throat very irritated, along with headache. 99s for temperature at home. No sick contacts- specifically on flu or strep.   Throat irritation worse- taking dayquil liquid into night  Nights worse  -day of illness:4 -Symptoms show no change after worsening yesterday -previous treatments: dayquil with some help, feels to groggy on nyquil -sick contacts/travel/risks: denies flu exposure.  -Hx of: similar illness about twice a year  ROS-denies fever- slightly elevated temperature, SOB, NVD, tooth pain  Pertinent Past Medical History-  Patient Active Problem List   Diagnosis Date Noted  . Mass of left side of neck 03/15/2016    Priority: Medium  . History of gastric ulcer     Priority: Low   Medications- reviewed  No current outpatient medications on file.   No current facility-administered medications for this visit.    Objective: BP 120/82 (BP Location: Left Arm, Patient Position: Sitting, Cuff Size: Large)   Pulse (!) 105   Temp 99.7 F (37.6 C) (Oral)   Ht 5' 11.5" (1.816 m)   Wt 232 lb 9.6 oz (105.5 kg)   SpO2 97%   BMI 31.99 kg/m  Gen: NAD, resting comfortably HEENT: Turbinates erythematous with yellow drainage, TM normal, pharynx mildly erythematous with no tonsilar exudate or edema, maxillary bilateral sinus tenderness CV: RRR no murmurs rubs or gallops Lungs: CTAB no crackles, wheeze, rhonchi Abdomen: soft/nontender/nondistended/normal bowel sounds.  Ext: no edema Skin: warm, dry, no rash  Results for orders placed or performed in visit on 07/04/18 (from the  past 24 hour(s))  POC Influenza A&B(BINAX/QUICKVUE)     Status: None   Collection Time: 07/04/18 10:32 AM  Result Value Ref Range   Influenza A, POC Negative Negative   Influenza B, POC Negative Negative  POCT rapid strep A     Status: None   Collection Time: 07/04/18 10:32 AM  Result Value Ref Range   Rapid Strep A Screen Negative Negative     Assessment/Plan:  Sinsusitis Viral based on <10 days, no double sickening, lack of severity of symptoms in first 3 days. Educated on signs that bacterial infection may have developed (symptoms over 10 days, double sickening).   Treatment: -considered steroid: we opted out of prednisone for now -other symptomatic care with dayquil - and can layer ibuprofen 200-400 mg every 6-8 hours on top of this for throat irritation -Antibiotic indicated: no but would be willing to call one in if symptoms fail to improve by Monday or if they worsen again before the weekend.   Finally, we reviewed reasons to return to care including if symptoms worsen or persist or new concerns arise (particularly fever or shortness of breath)  Tana ConchStephen Jerilyn Gillaspie, MD

## 2019-08-31 ENCOUNTER — Ambulatory Visit: Payer: BC Managed Care – PPO | Attending: Internal Medicine

## 2019-08-31 DIAGNOSIS — Z20822 Contact with and (suspected) exposure to covid-19: Secondary | ICD-10-CM

## 2019-09-01 LAB — NOVEL CORONAVIRUS, NAA: SARS-CoV-2, NAA: DETECTED — AB

## 2019-09-03 ENCOUNTER — Other Ambulatory Visit: Payer: BC Managed Care – PPO

## 2019-09-03 NOTE — Progress Notes (Signed)
Please check on Mr. Derek Carter to see how he is feeling.  Please also offer a follow-up video visit.Also please check on if he received a flu shot this year or if he would like to decline it with how late in the season it is

## 2019-09-06 ENCOUNTER — Telehealth: Payer: Self-pay | Admitting: Family Medicine

## 2019-09-06 NOTE — Telephone Encounter (Signed)
Patient called in wanting Dr.Hunter know that he has tested positive for covid and has a few questions.

## 2019-09-06 NOTE — Telephone Encounter (Signed)
Please schedule pt virtual to discuss questions.

## 2019-09-07 ENCOUNTER — Ambulatory Visit (INDEPENDENT_AMBULATORY_CARE_PROVIDER_SITE_OTHER): Payer: 59 | Admitting: Family Medicine

## 2019-09-07 ENCOUNTER — Encounter: Payer: Self-pay | Admitting: Family Medicine

## 2019-09-07 VITALS — Temp 97.9°F | Ht 72.0 in | Wt 240.0 lb

## 2019-09-07 DIAGNOSIS — U071 COVID-19: Secondary | ICD-10-CM

## 2019-09-07 DIAGNOSIS — Z803 Family history of malignant neoplasm of breast: Secondary | ICD-10-CM | POA: Diagnosis not present

## 2019-09-07 NOTE — Patient Instructions (Addendum)
Health Maintenance Due  Topic Date Due  . HIV Screening will address at next visit  07/26/2010    Depression screen [facility] 2/9 09/07/2019 02/13/2018 11/09/2016  Decreased Interest 0 0 0  Down, Depressed, Hopeless 0 0 0  PHQ - 2 Score 0 0 0  Altered sleeping 0 - -  Tired, decreased energy 0 - -  Change in appetite 0 - -  Feeling bad or failure about yourself  0 - -  Trouble concentrating 0 - -  Moving slowly or fidgety/restless 0 - -  Suicidal thoughts 0 - -  PHQ-9 Score 0 - -    Recommended follow up: No follow-ups on file.

## 2019-09-07 NOTE — Assessment & Plan Note (Signed)
March 2021-mother with history of breast cancer and apparently had a genetic component-patient was told by mom that he may need early colonoscopy.  He is going to try to get me more information at next physical

## 2019-09-07 NOTE — Progress Notes (Signed)
Phone [phone] Virtual visit via Video note   Subjective:  Chief complaint: Chief Complaint  Patient presents with  . Covid Exposure    Pos covid    This visit type was conducted due to national recommendations for restrictions regarding the COVID-19 Pandemic (e.g. social distancing).  This format is felt to be most appropriate for this patient at this time balancing risks to patient and risks to population by having him in for in person visit.  No physical exam was performed (except for noted visual exam or audio findings with Telehealth visits).    Our team/I connected with Marya Amsler IV at  3:40 PM EST by a video enabled telemedicine application (doxy.me or caregility through epic) and verified that I am speaking with the correct person using two identifiers.  Location patient: [place] Location provider: [facility], office Persons participating in the virtual visit:  patient  Our team/I discussed the limitations of evaluation and management by telemedicine and the availability of in person appointments. In light of current covid-19 pandemic, patient also understands that we are trying to protect them by minimizing in office contact if at all possible.  The patient expressed consent for telemedicine visit and agreed to proceed. Patient understands insurance will be billed.   Past Medical History-  Patient Active Problem List   Diagnosis Date Noted  . Mass of left side of neck 03/15/2016    Priority: Medium  . History of gastric ulcer     Priority: Low  . Family history of breast cancer 09/07/2019    Medications- reviewed and updated No current outpatient medications on file.   No current facility-administered medications for this visit.     Objective:  Temp 97.9 F (36.6 C)   Ht 6' (1.829 m)   Wt 240 lb (108.9 kg)   SpO2 98%   BMI 32.55 kg/m  self reported vitals Gen: NAD, resting comfortably Lungs: nonlabored, normal respiratory rate  Skin: appears dry, no  obvious rash     Assessment and Plan   # Covid-19 S tested positive on Feb 26,2021.  His first day of symptoms was Friday February 26th - his dad had tested positive the Tuesday prior  Around 22nd or 2rd.  Loss of taste and smell, dry cough, congestion. Had some fatigue and chills and headache early on as well but subsided prett quickly. By this point having very minimal cough and congestion has reatlly started to improve today. Patient taking OTC Day-Quil with some improvements   He has monitored his oxygen and is between 96 and 98%.  A/P: Patient with testing confirming covid 19 with first day of covid 19 symptoms  February 26th Therefore: - recommended patient watch closely for shortness of breath or confusion or worsening symptoms and if those occur he should contact us immediately or seek care in the emergency department -recommended patient consider purchasing pulse oximeter and if levels 94% or below persistently- seek care at the hospital - for quarantine if covid 19 needs to be at least 10 days since first symptom AND at least 24 hours fever free without fever reducing medications AND have improvement in respiratory symptoms  - earliest possible day out of quarantine march 8th, recommendations for patient - work note written (see letter)  We also discussed his quarantine-patient's girlfriend is a Social worker and she really wants to be more cautious as she would not want to expose any of the kids she cares for-we discussed extending his quarantine with her specifically to 84  days just to be on the safe side   Family history of breast cancer March 2021-mother with history of breast cancer and apparently had a genetic component-patient was told by mom that he may need early colonoscopy.  He is going to try to get me more information at next physical   Recommended follow up: Advised him to call and schedule next available physical No future appointments.  Lab/Order associations:    ICD-10-CM   1. COVID-19  U07.1   2. Family history of breast cancer  Z80.3    Time Spent: 20 minutes of total time (3:26 PM- 3:46 PM) was spent on the date of the encounter performing the following actions: chart review prior to seeing the patient, obtaining history, performing a medically necessary exam, counseling on the treatment plan, placing orders, and documenting in our EHR.   Return precautions advised.  Garret Reddish, MD

## 2020-01-10 ENCOUNTER — Encounter: Payer: Self-pay | Admitting: Family Medicine

## 2020-01-10 ENCOUNTER — Other Ambulatory Visit: Payer: Self-pay

## 2020-01-10 ENCOUNTER — Ambulatory Visit (INDEPENDENT_AMBULATORY_CARE_PROVIDER_SITE_OTHER): Payer: BC Managed Care – PPO | Admitting: Family Medicine

## 2020-01-10 VITALS — BP 110/76 | HR 71 | Temp 97.4°F | Ht 72.0 in | Wt 240.0 lb

## 2020-01-10 DIAGNOSIS — L0390 Cellulitis, unspecified: Secondary | ICD-10-CM

## 2020-01-10 MED ORDER — MUPIROCIN 2 % EX OINT: 1.0000 "application " | TOPICAL_OINTMENT | Freq: Two times a day (BID) | CUTANEOUS | 0 refills | Status: DC

## 2020-01-10 MED ORDER — DOXYCYCLINE HYCLATE 100 MG PO TABS
100.0000 mg | ORAL_TABLET | Freq: Two times a day (BID) | ORAL | 0 refills | Status: DC
Start: 2020-01-10 — End: 2020-03-18

## 2020-01-10 NOTE — Progress Notes (Signed)
   Derek Carter is a 24 y.o. male who presents today for an office visit.  Assessment/Plan:  New/Acute Problems: Cellulitis No red flags.  No obvious areas amenable to I&D today.  Given location of cellulitis, think it is very reasonable to start course of antibiotics.  Will start doxycycline 100 mg twice daily for 7 days.  We will also start topical mupirocin.  Discussed reasons to return to care.    Subjective:  HPI:  Symptoms started about 2 weeks ago. Located in his left nostril.  Found a nodule what looked like a white head about a week. Over the last week or so, symptoms have gotten worse. Very sensitive to the touch. No drainage.  No fevers or chills.       Objective:  Physical Exam: BP 110/76   Pulse 71   Temp (!) 97.4 F (36.3 C)   Ht 6' (1.829 m)   Wt 240 lb (108.9 kg)   SpO2 98%   BMI 32.55 kg/m   Gen: No acute distress, resting comfortably HEENT: Left nostril with scabbed over open wound.  Small amount of purulent drainage present. Skin: Left nostril slightly erythematous and indurated.      Katina Degree. Jimmey Ralph, MD 01/10/2020 1:19 PM

## 2020-01-10 NOTE — Patient Instructions (Signed)
It was very nice to see you today!  You have an infection in your skin.  Please start the doxycycline.  Please start the mupirocin as well.  Let us know if not improving in the next few days.  Take care, Dr Jimmey Ralph  Please try these tips to maintain a healthy lifestyle:   Eat at least 3 REAL meals and 1-2 snacks per day.  Aim for no more than 5 hours between eating.  If you eat breakfast, please do so within one hour of getting up.    Each meal should contain half fruits/vegetables, one quarter protein, and one quarter carbs (no bigger than a computer mouse)   Cut down on sweet beverages. This includes juice, soda, and sweet tea.     Drink at least 1 glass of water with each meal and aim for at least 8 glasses per day   Exercise at least 150 minutes every week.

## 2020-03-17 NOTE — Progress Notes (Signed)
Phone: [phone]    Subjective:  Patient presents today for their annual physical. Chief complaint-noted.   See problem oriented charting- Review of Systems  Constitutional: Negative for chills and fever.  HENT: Negative for congestion, hearing loss and sore throat.   Eyes: Negative for blurred vision and double vision.  Respiratory: Negative for cough and shortness of breath.   Cardiovascular: Negative for chest pain and palpitations.  Gastrointestinal: Negative for constipation, diarrhea, heartburn and nausea.  Genitourinary: Negative for dysuria and urgency.  Musculoskeletal: Negative for back pain and myalgias.  Skin: Negative for itching and rash.  Neurological: Negative for dizziness and headaches.  Endo/Heme/Allergies: Negative for polydipsia. Does not bruise/bleed easily.  Psychiatric/Behavioral: Negative for depression, substance abuse and suicidal ideas.   The following were reviewed and entered/updated in epic: Past Medical History:  Diagnosis Date  . Closed fibular fracture    and tibial pilon. age 70. Treated by [facility] Dr. Alphonzo Severance  . History of gastric ulcer    when on nsaids 2016- resolved on short term ppi  . Hypospadias    at age 37- saw Dr. Karsten Ro for this. plan was surgery onlyif significant chorde or deflection of stream.   . Left ACL tear    1997 seen by murphy wainer. do not have MRI report but thought. ACL vs. patellar dislocation- had effusion  . Mass of left side of neck 03/15/2016   Suspect lipoma  . Pilonidal cyst    notd by derm notes 2013 sent to Dr. Neldon Mc   Patient Active Problem List   Diagnosis Date Noted  . Mass of left side of neck 03/15/2016    Priority: Medium  . History of gastric ulcer     Priority: Low  . Family history of breast cancer 09/07/2019   Past Surgical History:  Procedure Laterality Date  . endoscopy     gastric ulcers 2016  . FINGER SURGERY Right    index finger  . KNEE ARTHROSCOPY Left     patella tendon repair    Family History  Problem Relation Age of Onset  . Breast cancer Mother   . Healthy Father        dad was adopted.   Marland Kitchen Healthy Brother   . Uterine cancer Maternal Grandmother   . Hypertension Maternal Grandfather   . Hyperlipidemia Maternal Grandfather     Medications- reviewed and updated No current outpatient medications on file.   No current facility-administered medications for this visit.    Allergies-reviewed and updated No Known Allergies  Social History   Social History Narrative   Single. Dating 2021 for almost 2 years- may take next steps.        Applying to grad school for [employer] and [employer]- wants to do counseling   Last semester spring 2021College student [employer]- communications.       HS wt 225- lifts at [employer], paintball 2 days a week, active at work      Objective:  BP 118/80   Pulse 64   Temp 98.8 F (37.1 C) (Temporal)   Resp 18   Ht 6' (1.829 m)   Wt 249 lb 3.2 oz (113 kg)   SpO2 98%   BMI 33.80 kg/m  Gen: NAD, resting comfortably HEENT: Mask not removed due to covid 19. TM normal. Bridge of nose normal. Eyelids normal.  Neck: stable thyromegaly and 5 x 5 cm area in left neck likely lipoma- remains mobile. No cervical lymphadenopathy  CV: RRR no  murmurs rubs or gallops Lungs: CTAB no crackles, wheeze, rhonchi Abdomen: soft/nontender/nondistended/normal bowel sounds. No rebound or guarding.  Ext: no edema Skin: warm, dry Neuro: grossly normal, moves all extremities, PERRLA     Assessment and Plan:  24 y.o. male presenting for annual physical.  Health Maintenance counseling: 1. Anticipatory guidance: Patient counseled regarding regular dental exams q6 months, eye exams last exam was a couple years ago- no vision issues,  avoiding smoking and second hand smoke, limiting alcohol to 2 beverages per day.   2. Risk factor reduction:  Advised patient of need for regular exercise and diet rich and fruits and vegetables to  reduce risk of heart attack and stroke. Exercise- ellipitical or treadmill 30 minutes or an hour most days a week for 10 days sometimes before work or after work.  Diet-hs weight 210-225 - would like to work towards this. About to start workout planning and meal prep to try to improve- wants to cut down on snacks and carbs/empty calories.  Wt Readings from Last 3 Encounters:  03/18/20 249 lb 3.2 oz (113 kg)  01/10/20 240 lb (108.9 kg)  09/07/19 240 lb (108.9 kg)  3. Immunizations/screenings/ancillary studies- declined flu vaccine. Declined covid 19 vaccine.   Immunization History  Administered Date(s) Administered  . DTaP 09/22/1995, 11/29/1995, 01/25/1996, 10/25/1996, 08/24/1999  . HPV Quadrivalent 01/22/2011, 02/03/2012  . Hepatitis A 11/26/2004, 07/07/2005  . Hepatitis B 08/09/1995, 09/22/1995, 04/25/1996  . HiB (PRP-OMP) 09/22/1995, 11/29/1995, 01/25/1996, 08/07/1996  . IPV 09/22/1995, 11/29/1995, 08/07/1996, 08/24/1999  . Influenza-Unspecified 07/07/2005, 06/17/2006, 07/18/2007  . MMR 08/07/1996, 08/24/1999  . Meningococcal Conjugate 12/01/2006  . Meningococcal Polysaccharide 02/03/2012  . Tdap 12/01/2006, 02/13/2018  . Varicella 10/25/1996, 12/01/2006  4. Prostate cancer screening- possible increased risk based on moms history- trying to get more information- still doubt would need to screen prior to 45  5. Colon cancer screening - possible increased risk based on moms history- trying to get more information- still doubt would need to screen prior to 35 6. Skin cancer screening/prevention- intermittent checks in past- no biopsies. advised regular sunscreen use. Denies worrisome, changing, or new skin lesions.  7. Testicular cancer screening- advised monthly self exams  8. STD screening- patient opts out - always uses protection when active.  57. Former smoker-quitn in 2015- only a year or so of smoking- would get AAA scan 65  Status of chronic or acute concerns   Mom with breast  cancer history- apparently he may need an early colonoscopy/prostate cancer screening with some connection. He is trying to get me more info so we can determine what his increased risks may be. Apparently there is some genetic abnormality- we may need to do referral to genetics.   #screening hyperlipidemia S: Medication:none  Lab Results  Component Value Date   CHOL 146 03/15/2016   HDL 46.40 03/15/2016   LDLCALC 92 03/15/2016   TRIG 40.0 03/15/2016   CHOLHDL 3 03/15/2016   A/P: LDL above ideal goal of 70 or less last time at 92 - we will update lipids today  # mass of left side of neck- will check cbc, cmp. Has not grown since 2016. Had ultrasound with Dr. Janace Hoard 2016- likely lipoma- they suggested consider CT or MRI if needed. Did look and had some thyromegaly and recommended repeat ultrasound in 12 months if thought risk factors for tyroid cancer. No family history of thyroid cancer.  -Discussed possible repeat ultrasound of thyroid as well as likely lipoma-with no obvious change in these areas  over the last 5 years we jointly agreed to not pursue this.  We will check a TSH and if this is off reconsider thyroid ultrasound  Recommended follow up: Return in about 1 year (around 03/18/2021) for physical or sooner if needed.  Lab/Order associations:Non fasting- last food 8 30 AM   ICD-10-CM   1. Preventative health care  O03.55 COMPLETE METABOLIC PANEL WITH GFR    CBC    Lipid panel    HIV Antibody (routine testing w rflx)    Hepatitis C antibody    TSH  2. Screening for HIV (human immunodeficiency virus)  Z11.4 HIV Antibody (routine testing w rflx)  3. Encounter for hepatitis C screening test for low risk patient  Z11.59 Hepatitis C antibody  4. Screening for hyperlipidemia  Z13.220 Lipid panel  5. Mass of left side of neck  H74.1 COMPLETE METABOLIC PANEL WITH GFR    CBC  6. Thyromegaly  U38.4 COMPLETE METABOLIC PANEL WITH GFR    CBC    TSH    No orders of the defined types were  placed in this encounter.   Return precautions advised.   Garret Reddish, MD

## 2020-03-17 NOTE — Patient Instructions (Addendum)
Please stop by lab before you go If you have mychart- we will send your results within 3 business days of [place] receiving them.  If you do not have mychart- we will call you about results within 5 business days of [place] receiving them.  *please note we are currently using [facility] which has a longer processing time than [facility] typically so labs may not come back as quickly as in the past *please also note that you will see labs on mychart as soon as they post. I will later go in and write notes on them- will say "notes from Dr. Durene Cal"  Lets work on at lest 15-25 lbs off (if not more ) by 1 year physical  congrats on the engagement!   Mineral oil for ear full of wax Purchase mineral oil from laxative aisle Lay down on your side with ear that is bothering you facing up Use 3-4 drops with a dropper and place in ear for 30 seconds Place cotton swab outside of ear Turn to other side and allow this to drain Repeat 3-4 x a day Return to see [place] if not improving within a few days   Health Maintenance Due  Topic Date Due  . COVID-19 Vaccine (1) Declined getting the covid vaccine. Never done  . INFLUENZA VACCINE Declined in office flu shot today. 02/03/2020

## 2020-03-18 ENCOUNTER — Other Ambulatory Visit: Payer: Self-pay

## 2020-03-18 ENCOUNTER — Encounter: Payer: Self-pay | Admitting: Family Medicine

## 2020-03-18 ENCOUNTER — Ambulatory Visit (INDEPENDENT_AMBULATORY_CARE_PROVIDER_SITE_OTHER): Payer: BC Managed Care – PPO | Admitting: Family Medicine

## 2020-03-18 VITALS — BP 118/80 | HR 64 | Temp 98.8°F | Resp 18 | Ht 72.0 in | Wt 249.2 lb

## 2020-03-18 DIAGNOSIS — Z0000 Encounter for general adult medical examination without abnormal findings: Secondary | ICD-10-CM | POA: Diagnosis not present

## 2020-03-18 DIAGNOSIS — E010 Iodine-deficiency related diffuse (endemic) goiter: Secondary | ICD-10-CM

## 2020-03-18 DIAGNOSIS — Z13220 Encounter for screening for lipoid disorders: Secondary | ICD-10-CM

## 2020-03-18 DIAGNOSIS — R221 Localized swelling, mass and lump, neck: Secondary | ICD-10-CM

## 2020-03-18 DIAGNOSIS — Z114 Encounter for screening for human immunodeficiency virus [HIV]: Secondary | ICD-10-CM

## 2020-03-18 DIAGNOSIS — Z1159 Encounter for screening for other viral diseases: Secondary | ICD-10-CM

## 2020-03-20 LAB — COMPLETE METABOLIC PANEL WITH GFR
AG Ratio: 2.2 (calc) (ref 1.0–2.5)
ALT: 46 U/L (ref 9–46)
AST: 20 U/L (ref 10–40)
Albumin: 4.8 g/dL (ref 3.6–5.1)
Alkaline phosphatase (APISO): 71 U/L (ref 36–130)
BUN: 16 mg/dL (ref 7–25)
CO2: 24 mmol/L (ref 20–32)
Calcium: 9.4 mg/dL (ref 8.6–10.3)
Chloride: 105 mmol/L (ref 98–110)
Creat: 1.1 mg/dL (ref 0.60–1.35)
GFR, Est African American: 108 mL/min/{1.73_m2} (ref >=60)
GFR, Est Non African American: 93 mL/min/{1.73_m2} (ref >=60)
Globulin: 2.2 g/dL (calc) (ref 1.9–3.7)
Glucose, Bld: 87 mg/dL (ref 65–99)
Potassium: 4.6 mmol/L (ref 3.5–5.3)
Sodium: 139 mmol/L (ref 135–146)
Total Bilirubin: 0.5 mg/dL (ref 0.2–1.2)
Total Protein: 7 g/dL (ref 6.1–8.1)

## 2020-03-20 LAB — CBC
HCT: 48 % (ref 38.5–50.0)
Hemoglobin: 16 g/dL (ref 13.2–17.1)
MCH: 28.8 pg (ref 27.0–33.0)
MCHC: 33.3 g/dL (ref 32.0–36.0)
MCV: 86.5 fL (ref 80.0–100.0)
MPV: 11.1 fL (ref 7.5–12.5)
Platelets: 255 10*3/uL (ref 140–400)
RBC: 5.55 10*6/uL (ref 4.20–5.80)
RDW: 12.7 % (ref 11.0–15.0)
WBC: 6.5 10*3/uL (ref 3.8–10.8)

## 2020-03-20 LAB — HEPATITIS C ANTIBODY
Hepatitis C Ab: NONREACTIVE
SIGNAL TO CUT-OFF: 0.02 (ref <1.00)

## 2020-03-20 LAB — LIPID PANEL
Cholesterol: 183 mg/dL (ref <200)
HDL: 50 mg/dL (ref >=40)
LDL Cholesterol (Calc): 119 mg/dL (calc) — ABNORMAL HIGH
Non-HDL Cholesterol (Calc): 133 mg/dL (calc) — ABNORMAL HIGH (ref <130)
Total CHOL/HDL Ratio: 3.7 (calc) (ref <5.0)
Triglycerides: 58 mg/dL (ref <150)

## 2020-03-20 LAB — HIV ANTIBODY (ROUTINE TESTING W REFLEX): HIV 1&2 Ab, 4th Generation: NONREACTIVE

## 2020-03-20 LAB — TSH: TSH: 2.52 mIU/L (ref 0.40–4.50)

## 2022-03-29 ENCOUNTER — Encounter: Payer: Self-pay | Admitting: *Deleted

## 2022-06-17 ENCOUNTER — Encounter: Payer: Self-pay | Admitting: *Deleted

## 2024-01-20 ENCOUNTER — Encounter: Payer: Self-pay | Admitting: Advanced Practice Midwife
